# Patient Record
Sex: Male | Born: 1965 | Race: Black or African American | Hispanic: No | Marital: Single | Smoking: Never smoker
Health system: Southern US, Community
[De-identification: ages and names within clinical notes are randomized; demographics above are authoritative.]

## PROBLEM LIST (undated history)

## (undated) HISTORY — PX: OTHER SURGICAL HISTORY: SHX169

---

## 2003-07-21 ENCOUNTER — Emergency Department (HOSPITAL_COMMUNITY): Admission: AD | Admit: 2003-07-21 | Discharge: 2003-07-21 | Payer: Self-pay | Admitting: Family Medicine

## 2003-09-13 ENCOUNTER — Emergency Department (HOSPITAL_COMMUNITY): Admission: EM | Admit: 2003-09-13 | Discharge: 2003-09-13 | Payer: Self-pay | Admitting: Family Medicine

## 2006-11-22 ENCOUNTER — Emergency Department (HOSPITAL_COMMUNITY): Admission: EM | Admit: 2006-11-22 | Discharge: 2006-11-22 | Payer: Self-pay | Admitting: Family Medicine

## 2006-12-13 ENCOUNTER — Emergency Department (HOSPITAL_COMMUNITY): Admission: EM | Admit: 2006-12-13 | Discharge: 2006-12-13 | Payer: Self-pay | Admitting: Emergency Medicine

## 2006-12-19 ENCOUNTER — Inpatient Hospital Stay (HOSPITAL_COMMUNITY): Admission: EM | Admit: 2006-12-19 | Discharge: 2006-12-21 | Payer: Self-pay | Admitting: Emergency Medicine

## 2007-01-10 ENCOUNTER — Ambulatory Visit (HOSPITAL_COMMUNITY): Admission: RE | Admit: 2007-01-10 | Discharge: 2007-01-10 | Payer: Self-pay | Admitting: Neurology

## 2008-02-08 IMAGING — CT CT CHEST W/ CM
2 of 4 series · 15 of 36 positions shown, 18 images · IV contrast (APPLIED)
Comparison: None.

CLINICAL DATA: Evaluate mass.  Extremity heaviness and weakness.
 CHEST CT WITH CONTRAST:
TECHNIQUE: Multidetector CT imaging of the chest was performed following the standard protocol during bolus administration of intravenous contrast.
 Contrast: 80 cc Omnipaque 300.

[Series 2: routine chest 5.0 st · axial · 0.66mm/px · z∈[-352,-88]mm · 12 of 63 slices shown, 15 images]
[im 5/63  mediastinal]
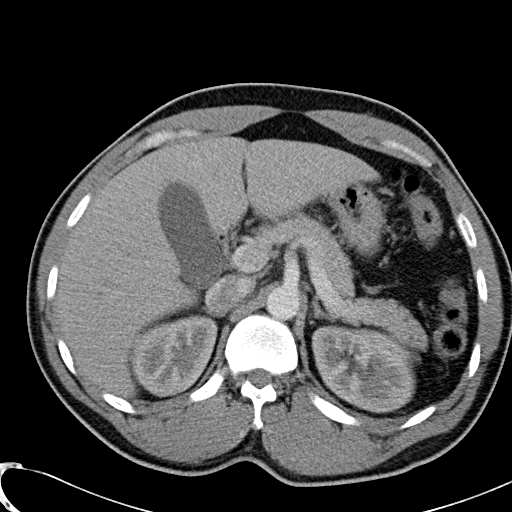
[im 5/63  lung]
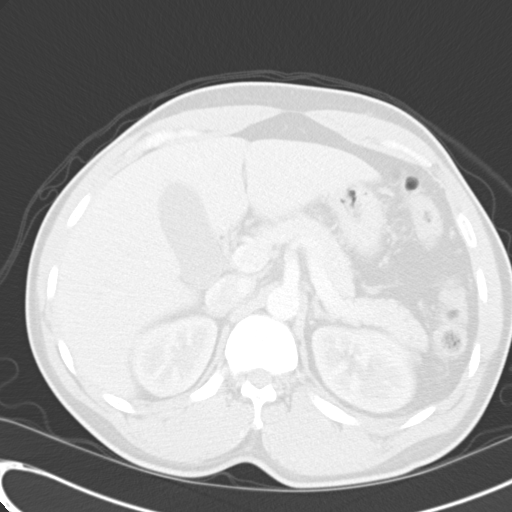
[im 9/63  lung]
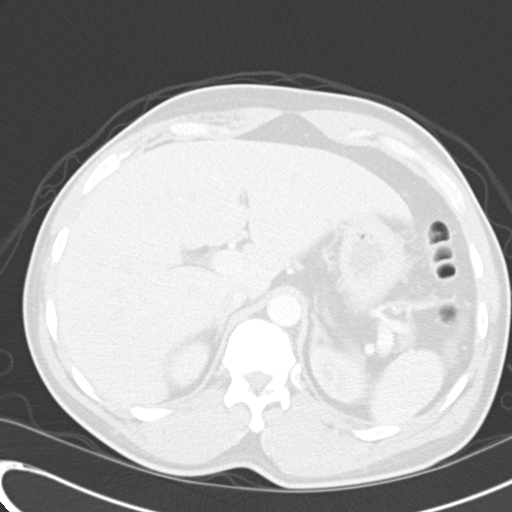
[im 14/63  lung]
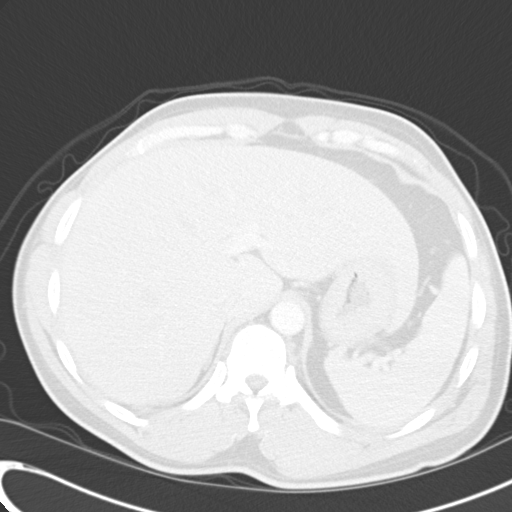
[im 18/63  lung]
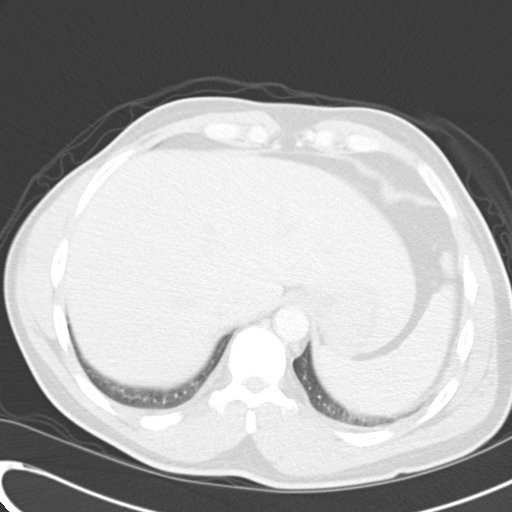
[im 23/63  mediastinal]
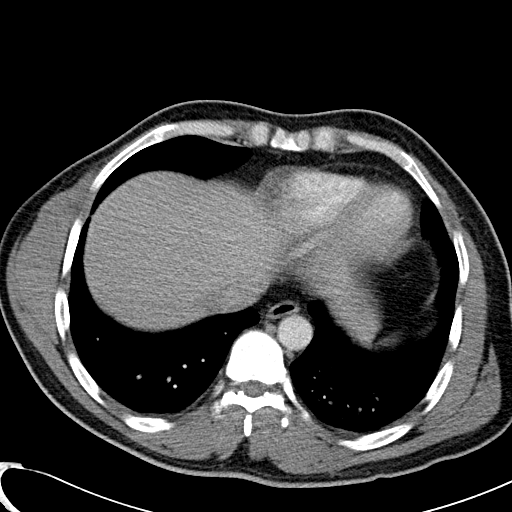
[im 23/63  lung]
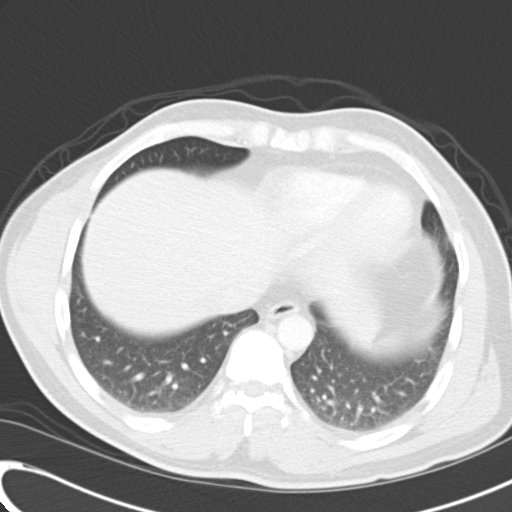
[im 27/63  lung]
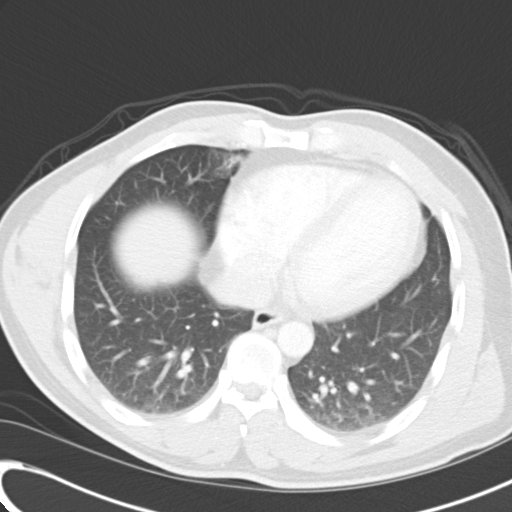
[im 36/63  lung]
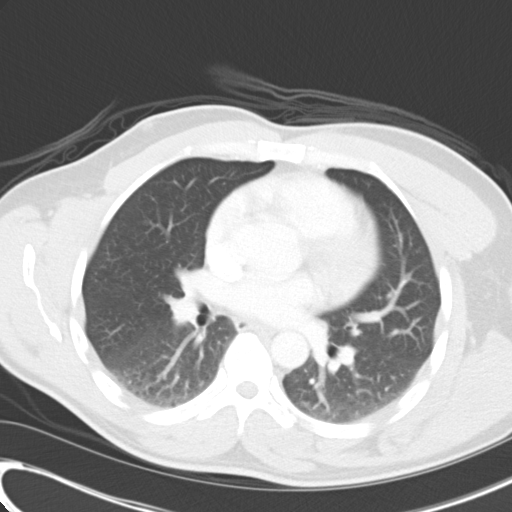
[im 40/63  lung]
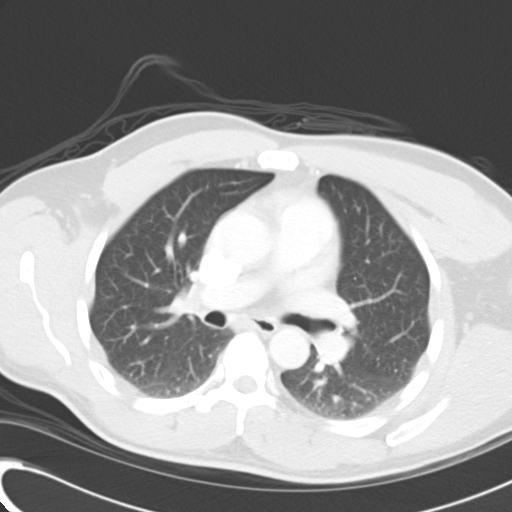
[im 45/63  mediastinal]
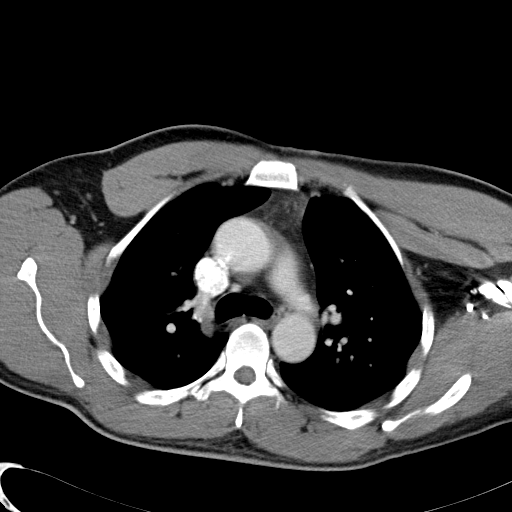
[im 45/63  lung]
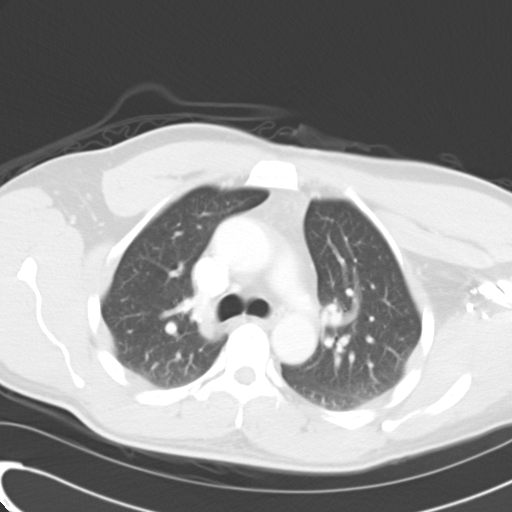
[im 49/63  lung]
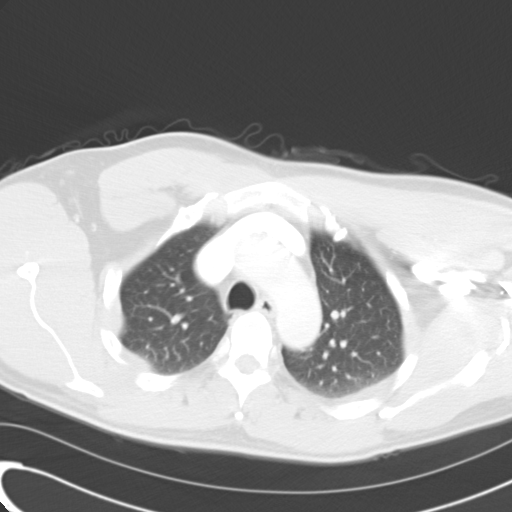
[im 54/63  lung]
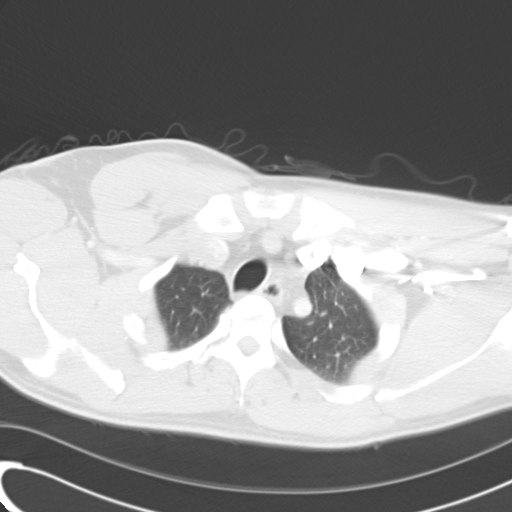
[im 58/63  lung]
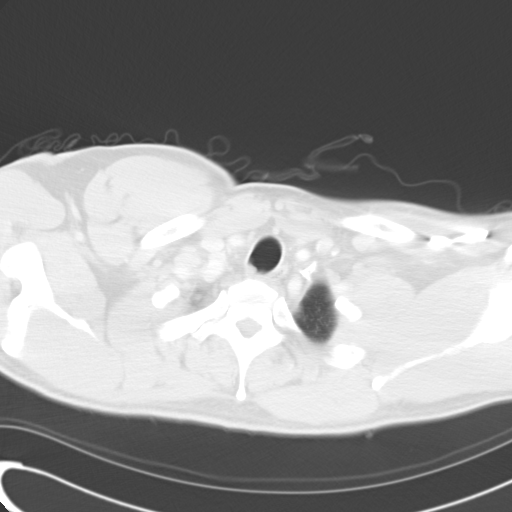

[Series 602: coronal chest · coronal · 0.66mm/px · 3 of 106 slices shown]
[im 22/106  lung]
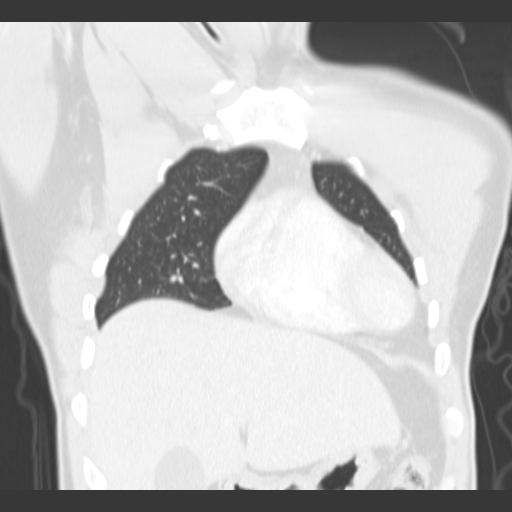
[im 43/106  lung]
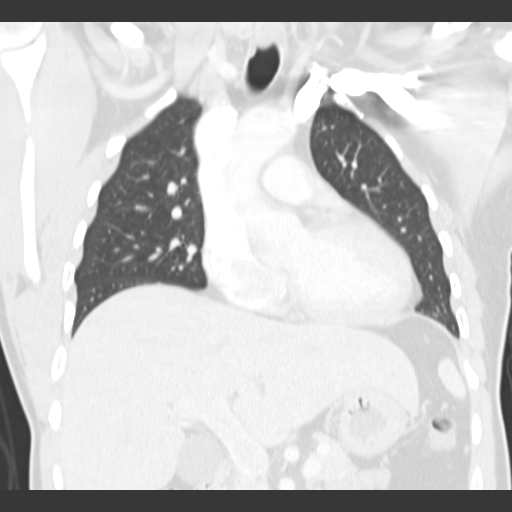
[im 64/106  lung]
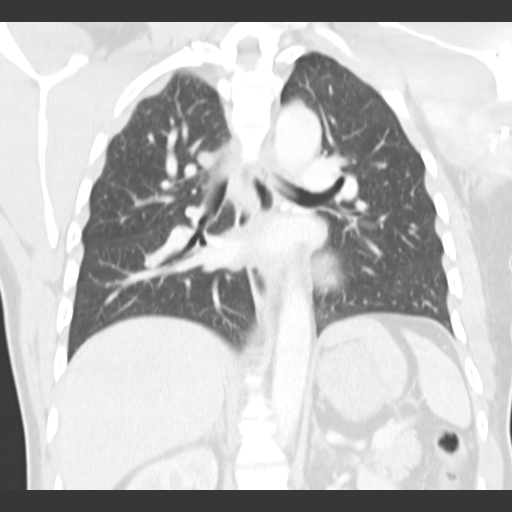

[15 of 36 positions shown; findings below may reference images not displayed]

FINDINGS: No pathologically enlarged mediastinal, hilar or axillary lymph nodes.  No mediastinal mass.  Heart size is at the upper limits of normal to mildly enlarged.  Mild bilateral gynecomastia.  Debris is seen in the dependent portion of the upper trachea.  Lungs are clear.  No pleural fluid.  
 Incidental imaging of the upper abdomen shows no acute findings.  No worrisome lytic or sclerotic lesions.
IMPRESSION: No acute findings.  No findings to explain the patient?s given symptoms.

## 2009-06-14 ENCOUNTER — Emergency Department (HOSPITAL_COMMUNITY): Admission: EM | Admit: 2009-06-14 | Discharge: 2009-06-14 | Payer: Self-pay | Admitting: Emergency Medicine

## 2010-06-20 ENCOUNTER — Encounter: Payer: Self-pay | Admitting: Internal Medicine

## 2010-06-20 ENCOUNTER — Ambulatory Visit (INDEPENDENT_AMBULATORY_CARE_PROVIDER_SITE_OTHER): Payer: Commercial Managed Care - PPO | Admitting: Internal Medicine

## 2010-06-20 DIAGNOSIS — Z23 Encounter for immunization: Secondary | ICD-10-CM

## 2010-06-20 DIAGNOSIS — G7000 Myasthenia gravis without (acute) exacerbation: Secondary | ICD-10-CM | POA: Insufficient documentation

## 2010-06-20 DIAGNOSIS — Z0000 Encounter for general adult medical examination without abnormal findings: Secondary | ICD-10-CM

## 2010-06-21 ENCOUNTER — Encounter: Payer: Self-pay | Admitting: Internal Medicine

## 2010-06-22 ENCOUNTER — Other Ambulatory Visit (INDEPENDENT_AMBULATORY_CARE_PROVIDER_SITE_OTHER): Payer: Commercial Managed Care - PPO

## 2010-06-22 ENCOUNTER — Other Ambulatory Visit: Payer: Self-pay | Admitting: Internal Medicine

## 2010-06-22 ENCOUNTER — Encounter (INDEPENDENT_AMBULATORY_CARE_PROVIDER_SITE_OTHER): Payer: Self-pay | Admitting: *Deleted

## 2010-06-22 DIAGNOSIS — Z0000 Encounter for general adult medical examination without abnormal findings: Secondary | ICD-10-CM

## 2010-06-22 LAB — BASIC METABOLIC PANEL
BUN: 11 mg/dL (ref 6–23)
CO2: 28 mEq/L (ref 19–32)
Calcium: 9.4 mg/dL (ref 8.4–10.5)
GFR: 85.05 mL/min (ref >60.00)
Sodium: 140 mEq/L (ref 135–145)

## 2010-06-22 LAB — CBC WITH DIFFERENTIAL/PLATELET
Basophils Relative: 0.6 % (ref 0.0–3.0)
Eosinophils Absolute: 0.1 10*3/uL (ref 0.0–0.7)
Eosinophils Relative: 1.8 % (ref 0.0–5.0)
Lymphs Abs: 1.7 10*3/uL (ref 0.7–4.0)
Monocytes Absolute: 0.7 10*3/uL (ref 0.1–1.0)
Neutro Abs: 3.6 10*3/uL (ref 1.4–7.7)

## 2010-06-22 LAB — LIPID PANEL
Cholesterol: 150 mg/dL (ref 0–200)
HDL: 40.7 mg/dL (ref >39.00)
Total CHOL/HDL Ratio: 4
Triglycerides: 64 mg/dL (ref 0.0–149.0)
VLDL: 12.8 mg/dL (ref 0.0–40.0)

## 2010-06-22 LAB — AST: AST: 23 U/L (ref 0–37)

## 2010-06-22 LAB — TSH: TSH: 0.44 u[IU]/mL (ref 0.35–5.50)

## 2010-06-27 ENCOUNTER — Telehealth: Payer: Self-pay | Admitting: Internal Medicine

## 2010-06-27 ENCOUNTER — Encounter (INDEPENDENT_AMBULATORY_CARE_PROVIDER_SITE_OTHER): Payer: Self-pay | Admitting: *Deleted

## 2010-06-28 ENCOUNTER — Encounter: Payer: Self-pay | Admitting: Internal Medicine

## 2010-06-28 NOTE — Miscellaneous (Signed)
Summary: Drugs that may unmask or exacerbate myasthenia gravis  Drugs that may unmask or exacerbate myasthenia gravis*  Anesthetic agents Antirheumatic drugs Chloroprocaine Chloroquine Diazepam Penicillamine Ether Cardiovascular drugs Halothane Beta blockers Ketamine Bretylium Lidocaine Procainamide Neuromuscular blocking agents Propafenone Propanidid Quinidine Procaine Verapamil and calcium channel blockers Antibiotics Glucocorticoids Aminoglycosides Corticotropin Amikacin Methylprednisolone Gentamicin Prednisone Kanamycin Neuromuscular blockers and muscle relaxants Neomycin Botulinum toxin Netilmicin Magnesium sulfate and magnesium salts Paromomycin Methocarbamol Spectinomycin Ophthalmologic drugs Streptomycin Betaxolol Tobramycin Echothiophate Fluoroquinolones Timolol Ciprofloxacin Tropicamide Gemifloxacin Proparacaine Levofloxacin Other drugs Moxifloxacin Anticholinergics Norfloxacin Carnitine Ofloxacin Cholinesterase inhibitors Others Deferoxamine Ampicillin Diuretics Clarithromycin Emetine (Ipecac syrup) Clindamycin Interferon alpha Colistin Iodinated contrast agents Erythromycin Narcotics Lincomycin Oral contraceptives Quinine Oxytocin Telithromycin Ritonavir and antiretroviral protease inhibitors Tetracyclines Statins Anticonvulsants Thyroxine Gabapentin  Phenytoin  Trimethadione  Antipsychotics  Chlorpromazine  Lithium  Phenothiazines  * Drugs listed here should be used with caution in patients with myasthenia gravis. Aminoglycosides should be used only if absolutely necessary with close monitoring. Please refer to the text for further information.   2012 [employer]. All rights reserved.   Support

## 2010-06-28 NOTE — Letter (Signed)
Summary: Pre Visit Letter Revised  [facility]  [address] [place], [place] [address]   Phone: [phone]  Fax: [phone]        06/21/2010 MRN: [id number]  [facility] [address] [place], [place]  [address]  [place]             Procedure Date:  07-13-10 9:30am           Dr Leone Payor Direct Colon   Welcome to the Gastroenterology Division at [facility].    You are scheduled to see a nurse for your pre-procedure visit on 06-29-10 at 9:30am on the 3rd floor at [facility], [address]  We ask that you try to arrive at our office 15 minutes prior to your appointment time to allow for check-in.  Please take a minute to review the attached form.  If you answer "Yes" to one or more of the questions on the first page, we ask that you call the person listed at your earliest opportunity.  If you answer "No" to all of the questions, please complete the rest of the form and bring it to your appointment.    Your nurse visit will consist of discussing your medical and surgical history, your immediate family medical history, and your medications.   If you are unable to list all of your medications on the form, please bring the medication bottles to your appointment and we will list them.  We will need to be aware of both prescribed and over the counter drugs.  We will need to know exact dosage information as well.    Please be prepared to read and sign documents such as consent forms, a financial agreement, and acknowledgement forms.  If necessary, and with your consent, a friend or relative is welcome to sit-in on the nurse visit with you.  Please bring your insurance card so that we may make a copy of it.  If your insurance requires a referral to see a specialist, please bring your referral form from your primary care physician.  No co-pay is required for this nurse visit.     If you cannot keep your appointment, please call [phone] to cancel or  reschedule prior to your appointment date.  This allows [place] the opportunity to schedule an appointment for another patient in need of care.    Thank you for choosing [facility] for your medical needs.  We appreciate the opportunity to care for you.  Please visit [place] at our website  to learn more about our practice.  Sincerely, The Gastroenterology Division

## 2010-06-28 NOTE — Assessment & Plan Note (Signed)
Summary: new to establish/cbs   Vital Signs:  Patient profile:   45 year old male Height:      75.5 inches Weight:      214 pounds BMI:     26.49 Pulse rate:   82 / minute Pulse rhythm:   regular BP sitting:   152 / 88  (left arm) Cuff size:   regular  Vitals Entered By: Army Fossa CMA (June 20, 2010 1:10 PM) CC: New to est, CPX- not fasting  Comments unsure of pharm   History of Present Illness: new patient   CPX Also has a history of myasthenia gravis, he has taken prednisone for a long time, recently, his neurologist moved and he is having a hard time getting seen or getting his prescriptions RF. As a consequence, he has stopped his prednisone several weeks ago and he only takes few pills now and then if he feels his myasthenia is acting up. Today he feels very well.  Preventive Screening-Counseling & Management  Alcohol-Tobacco     Smoking Status: never  Caffeine-Diet-Exercise     Does Patient Exercise: no  Current Medications (verified): 1)  Prednisone 10 Mg Tabs (Prednisone) .Marland Kitchen.. 1 By Mouth Daily.  Allergies (verified): No Known Drug Allergies  Past History:  Risk Factors: Exercise: no (06/20/2010)  Risk Factors: Smoking Status: never (06/20/2010)  Past Medical History: Myasthenia Gravis  dx-- 2008    Past Surgical History: no major surgeries   Family History: DM--no CAD--no colon ca--no prostate ca--no  Social History: Single children x 4  original from [place] religion--muslim in [place] since  ~ 1999 works at a hotel Never Smoked Alcohol use-no Regular exercise-no Smoking Status:  never Does Patient Exercise:  no  Review of Systems General:  Denies fever and weight loss. CV:  Denies chest pain or discomfort. Resp:  Denies cough, shortness of breath, sputum productive, and wheezing. GI:  Denies bloody stools, diarrhea, nausea, and vomiting. GU:  Denies dysuria and hematuria.  Physical Exam  General:  alert,  well-developed, and well-nourished.   Neck:  no masses, no thyromegaly, and normal carotid upstroke.   Lungs:  normal respiratory effort, no intercostal retractions, no accessory muscle use, and normal breath sounds.   Heart:  normal rate, regular rhythm, no murmur, and no gallop.   Abdomen:  soft, non-tender, no distention, no masses, no guarding, and no rigidity.   Rectal:  No external abnormalities noted. Normal sphincter tone. No rectal masses or tenderness. brown stool, Hemoccult trace positive Prostate:  Prostate gland firm and smooth, no enlargement, nodularity, tenderness, mass, asymmetry or induration. Extremities:  no edema Psych:  Oriented X3, memory intact for recent and remote, normally interactive, good eye contact, not anxious appearing, and not depressed appearing.     Impression & Recommendations:  Problem # 1:  ROUTINE GENERAL MEDICAL EXAM@HEALTH  CARE FACL (ICD-V70.0) Td -- > 10 years , gave one today flu shot-- recommended   I started his colon cancer screening today with Hemoccult, it is positive, refer to GI We'll also check a PSA (he is African American) Labs diet-exercise --- discussed, info provided   Orders: Gastroenterology Referral (GI)  Problem # 2:  MYASTHENIA GRAVIS WITHOUT EXACERBATION (ICD-358.00)  currently asymptomatic, we'll try to get him to see a neurologist as soon as possible.  Orders: Neurology Referral (Neuro)  Complete Medication List: 1)  Prednisone 10 Mg Tabs (Prednisone) .Marland Kitchen.. 1 by mouth daily.  Other Orders: Tdap => 86yrs IM (13086) Admin 1st Vaccine (57846)  Patient Instructions: 1)  make an appointment for labs, fasting: 2)  FLP PSA BMP AST ALT TSH CBC--- dx v70 3)  get a flu shot  4)  Please schedule a follow-up appointment in 6 months .    Orders Added: 1)  Tdap => 61yrs IM [90715] 2)  Admin 1st Vaccine [90471] 3)  Gastroenterology Referral [GI] 4)  New Patient 40-64 years [99386] 5)  Neurology Referral  [Neuro]   Immunizations Administered:  Tetanus Vaccine:    Vaccine Type: Tdap    Site: right deltoid    Mfr: GlaxoSmithKline    Dose: 0.5 ml    Route: IM    Given by: Army Fossa CMA    Exp. Date: 02/24/2012    Lot #: [id number]   Immunizations Administered:  Tetanus Vaccine:    Vaccine Type: Tdap    Site: right deltoid    Mfr: GlaxoSmithKline    Dose: 0.5 ml    Route: IM    Given by: Army Fossa CMA    Exp. Date: 02/24/2012    Lot #: [id number]   Risk Factors:  Tobacco use:  never Alcohol use:  no Exercise:  no

## 2010-06-30 ENCOUNTER — Other Ambulatory Visit (AMBULATORY_SURGERY_CENTER): Payer: Commercial Managed Care - PPO | Admitting: Internal Medicine

## 2010-06-30 ENCOUNTER — Other Ambulatory Visit: Payer: Self-pay | Admitting: Internal Medicine

## 2010-06-30 DIAGNOSIS — K648 Other hemorrhoids: Secondary | ICD-10-CM

## 2010-06-30 DIAGNOSIS — R195 Other fecal abnormalities: Secondary | ICD-10-CM

## 2010-06-30 DIAGNOSIS — D126 Benign neoplasm of colon, unspecified: Secondary | ICD-10-CM

## 2010-06-30 DIAGNOSIS — K921 Melena: Secondary | ICD-10-CM

## 2010-07-04 NOTE — Progress Notes (Signed)
Summary: needs CRNA/propofol  Phone Note Outgoing Call   Summary of Call: He has myasthenia gravis and I think colonoscopy best done with CRNA sedation and propofol most likely he has previsit 2/23 I still have availability for 2/24 propofol - ? if possible to line him up for colonoscopy then? if he is able if not should be set up with propofol/CRNA next scheduled date Iva Boop MD, [facility]  June 27, 2010 12:42 PM    Follow-up for Phone Call        Patient states he was unaware of either appointment.  He is scheduled for a colon with propofol for this Friday in the [facility] 2/24 and pre-visit is rescheduled to tomorrow at 2:30 Follow-up by: Darcey Nora RN, CGRN,  June 27, 2010 4:29 PM

## 2010-07-04 NOTE — Miscellaneous (Signed)
Summary: [facility] Previsit/prep  Clinical Lists Changes  Medications: Added new medication of MOVIPREP 100 GM  SOLR (PEG-KCL-NACL-NASULF-NA ASC-C) As per prep instructions. - Signed Rx of MOVIPREP 100 GM  SOLR (PEG-KCL-NACL-NASULF-NA ASC-C) As per prep instructions.;  #1 x 0;  Signed;  Entered by: Wyona Almas RN;  Authorized by: Iva Boop MD, [facility];  Method used: Print then Give to Patient Observations: Added new observation of NKA: T (06/28/2010 14:27)    Prescriptions: MOVIPREP 100 GM  SOLR (PEG-KCL-NACL-NASULF-NA ASC-C) As per prep instructions.  #1 x 0   Entered by:   Wyona Almas RN   Authorized by:   Iva Boop MD, [facility]   Signed by:   Wyona Almas RN on 06/28/2010   Method used:   Print then Give to Patient   RxID:   [id number]

## 2010-07-04 NOTE — Letter (Signed)
Summary: [facility] Instructions  [facility]  [address] [place], [place] [address]   Phone: [phone]  Fax: [phone]       Jason Ward    March 30, 1966    MRN: [id number]        Procedure Day Jason Ward:  Jason Ward  06/30/10     Arrival Time:  10:00AM     Procedure Time:  11:00AM     Location of Procedure:                    _ X_  Jason Ward (4th Floor)                      PREPARATION FOR COLONOSCOPY WITH MOVIPREP   Starting 5 days prior to your procedure 06/25/10 do not eat nuts, seeds, popcorn, corn, beans, peas,  salads, or any raw vegetables.  Do not take any fiber supplements (e.g. Metamucil, Citrucel, and Benefiber).  THE DAY BEFORE YOUR PROCEDURE         DATE: 06/29/10  DAY: THURSDAY  1.  Drink clear liquids the entire day-NO SOLID FOOD  2.  Do not drink anything colored red or purple.  Avoid juices with pulp.  No orange juice.  3.  Drink at least 64 oz. (8 glasses) of fluid/clear liquids during the day to prevent dehydration and help the prep work efficiently.  CLEAR LIQUIDS INCLUDE: Water Jello Ice Popsicles Tea (sugar ok, no milk/cream) Powdered fruit flavored drinks Coffee (sugar ok, no milk/cream) Gatorade Juice: apple, white grape, white cranberry  Lemonade Clear bullion, consomm, broth Carbonated beverages (any kind) Strained chicken noodle soup Hard Candy                             4.  In the morning, mix first dose of MoviPrep solution:    Empty 1 Pouch A and 1 Pouch B into the disposable container    Add lukewarm drinking water to the top line of the container. Mix to dissolve    Refrigerate (mixed solution should be used within 24 hrs)  5.  Begin drinking the prep at 5:00 p.m. The MoviPrep container is divided by 4 marks.   Every 15 minutes drink the solution down to the next mark (approximately 8 oz) until the full liter is complete.   6.  Follow completed prep with 16 oz of clear liquid of your choice  (Nothing red or purple).  Continue to drink clear liquids until bedtime.  7.  Before going to bed, mix second dose of MoviPrep solution:    Empty 1 Pouch A and 1 Pouch B into the disposable container    Add lukewarm drinking water to the top line of the container. Mix to dissolve    Refrigerate  THE DAY OF YOUR PROCEDURE      DATE: 06/30/10   DAY: FRIDAY  Beginning at 6:00AM (5 hours before procedure):         1. Every 15 minutes, drink the solution down to the next mark (approx 8 oz) until the full liter is complete.  2. Follow completed prep with 16 oz. of clear liquid of your choice.    3. You may drink clear liquids until 9:00AM (2 HOURS BEFORE PROCEDURE).   MEDICATION INSTRUCTIONS  Unless otherwise instructed, you should take regular prescription medications with a small sip of water   as early as possible the morning of  your procedure.        OTHER INSTRUCTIONS  You will need a responsible adult at least 47 years of age to accompany you and drive you home.   This person must remain in the waiting room during your procedure.  Wear loose fitting clothing that is easily removed.  Leave jewelry and other valuables at home.  However, you may wish to bring a book to read or  an iPod/MP3 player to listen to music as you wait for your procedure to start.  Remove all body piercing jewelry and leave at home.  Total time from sign-in until discharge is approximately 2-3 hours.  You should go home directly after your procedure and rest.  You can resume normal activities the  day after your procedure.  The day of your procedure you should not:   Drive   Make legal decisions   Operate machinery   Drink alcohol   Return to work  You will receive specific instructions about eating, activities and medications before you leave.    The above instructions have been reviewed and explained to me by  Jason Almas RN  June 28, 2010 3:24 PM     I fully understand  and can verbalize these instructions _____________________________ Date _________

## 2010-07-04 NOTE — Procedures (Addendum)
Summary: Colonoscopy  Patient: [name] Note: All result statuses are Final unless otherwise noted.  Tests: (1) Colonoscopy (COL)   COL Colonoscopy           DONE     [facility]     [address]     [place], [place]  [address]           COLONOSCOPY PROCEDURE REPORT           PATIENT:  Jason Ward, Jason Ward  MR#:  [id number]     BIRTHDATE:  [birth year], 45 yrs. old  GENDER:  male     ENDOSCOPIST:  Iva Boop, MD, [facility]     REF. BY:  Willow Ora, M.D.     PROCEDURE DATE:  06/30/2010     PROCEDURE:  Colonoscopy with snare polypectomy     ASA CLASS:  Class III     INDICATIONS:  heme positive stool     MEDICATIONS:   MAC sedation, administered by CRNA, propofol     (Diprivan) 200 mg IV           DESCRIPTION OF PROCEDURE:   After the risks benefits and     alternatives of the procedure were thoroughly explained, informed     consent was obtained.  Digital rectal exam was performed and     revealed no abnormalities and normal prostate.   The LB 180AL     [id number] endoscope was introduced through the anus and advanced to     the cecum, which was identified by both the appendix and ileocecal     valve, without limitations.  The quality of the prep was     excellent, using MoviPrep.  The instrument was then slowly     withdrawn as the colon was fully examined. Insertion     <<PROCEDUREIMAGES>>           FINDINGS:  A sessile polyp was found in the distal transverse     colon. It was 8 - 9 mm in size. Polyp was snared, then cauterized     with monopolar cautery. Retrieval was successful. This was     otherwise a normal examination of the colon, including right colon     retroflexion.   Retroflexed views in the rectum revealed internal     hemorrhoids.    The scope was then withdrawn from the patient and     the procedure completed.           COMPLICATIONS:  None     ENDOSCOPIC IMPRESSION:     1) 8 - 9 mm sessile polyp in the distal transverse colon     2) Internal  hemorrhoids     3) Otherwise normal examination     RECOMMENDATIONS:     1) Hold aspirin, aspirin products, and anti-inflammatory     medication for 2 weeks.     REPEAT EXAM:  In for Colonoscopy, pending biopsy results.           Iva Boop, MD, [facility]           CC:  Willow Ora, MD and The Patient           n.     eSIGNED:   Iva Boop at 06/30/2010 12:19 PM           Avner, [place], [id number]  Note: An exclamation mark (!) indicates a result that was not dispersed into the flowsheet. Document Creation Date: 06/30/2010 12:19 PM _______________________________________________________________________  Marland Kitchen  1) Order result status: Final Collection or observation date-time: 06/30/2010 12:10 Requested date-time:  Receipt date-time:  Reported date-time:  Referring Physician:   Ordering Physician: Stan Head [phone]) Specimen Source:  Source: Launa Grill Order Number: [id number] Lab site:   Appended Document: Colonoscopy     Procedures Next Due Date:    Colonoscopy: 07/2015

## 2010-07-05 ENCOUNTER — Encounter: Payer: Self-pay | Admitting: Internal Medicine

## 2010-07-13 ENCOUNTER — Other Ambulatory Visit: Payer: Commercial Managed Care - PPO | Admitting: Internal Medicine

## 2010-07-13 NOTE — Letter (Signed)
Summary: Patient Notice- Polyp Results  [facility]  [address] [place], [place] [address]   Phone: [phone]  Fax: [phone]        July 05, 2010 MRN: [id number]    [facility] [address] [place], [place]  [address]    Dear Mr. 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 This means that it was pre-cancerous or that  it had the potential to change into cancer over time.  I recommend that you have a repeat colonoscopy in 5 years to determine if you have developed any new polyps over time and screen for colorectal cancer. If you develop any new rectal bleeding, abdominal pain or significant bowel habit changes, please contact us before then.  In addition to repeating colonoscopy, changing health habits may reduce your risk of having more colon or rectal  polyps and possibly, colorectal cancer. You may lower your risk of future polyps and colorectal cancer by adopting healthy habits such as not smoking or using tobacco (if you do), being physically active, losing weight (if overweight), and eating a diet which includes fruits and vegetables and limits red meat.  Please call us if you are having persistent problems or have questions about your condition that have not been fully answered at this time.  Sincerely,  Iva Boop MD, [facility]  This letter has been electronically signed by your physician.  Appended Document: Patient Notice- Polyp Results letter mailed

## 2010-09-19 NOTE — H&P (Signed)
NAMEJENNIFER, Jason Ward           ACCOUNT NO.:  [id number]   MEDICAL RECORD NO.:  [id number]          PATIENT TYPE:  INP   LOCATION:  5148                         FACILITY:  [facility]   PHYSICIAN:  Bevelyn Buckles. Champey, M.D.DATE OF BIRTH:  07/31/1965   DATE OF ADMISSION:  12/19/2006  DATE OF DISCHARGE:                              HISTORY & PHYSICAL   REASON FOR ADMISSION:  Weakness/myasthenia gravis.   REQUESTING PHYSICIANS:  Dr. Bernette Mayers and Dr. Ignacia Palma.   HISTORY OF PRESENT ILLNESS:  Jason Ward is a 45 year old African  American male with a recent diagnosis of myasthenia gravis by Dr.  Thad Ranger, who presents with increased weakness and fall.  The patient  was scheduled for outpatient IVIG and was going to start IVIG when the  patient became extremely weak and fell to the floor, stating his lower  extremities gave out and he needed assistance to get back up on his  feet.  He is feeling much better and stronger, however, still feels  weakness in his legs.  He denies any dizziness, loss of consciousness or  lightheadedness.  This greatly  improved.  Denies any current numbness,  diplopia, speech changes, swallowing problems, chewing problems,  shortness of breath, vertigo or loss of consciousness.   PAST MEDICAL HISTORY:  Positive for recent diagnosis of myasthenia.   CURRENT MEDICATIONS:  Mestinon 60 mg t.i.d.   ALLERGIES:  NO KNOWN DRUG ALLERGIES.   FAMILY HISTORY:  Positive for hypertension.   SOCIAL HISTORY:  The patient lives with his roommate.  Denies any  alcohol or drug use.   REVIEW OF SYSTEMS:  Positive as per HPI.  Review of systems negative as  per HPI and greater than 7 other organ systems.   PHYSICAL EXAMINATION:  VITAL SIGNS:  Temperature is 97.4, blood pressure  is 140/83, pulse is 89, respirations 18.  HEENT:  Normocephalic, atraumatic.  The patient has scar around his  right eye from old injury.  The patient has some slight right ptosis.  Face is otherwise  symmetric.  NECK:  Supple.  HEART:  Regular.  LUNGS:  Clear.  ABDOMEN:  Soft.  EXTREMITIES: Show good pulses.  NEUROLOGICAL EXAMINATION:  The patient is awake, alert.  Language is  fluent.  The patient is following commands appropriately.  Cranial  nerves: The patient has right ptosis.  The rest of cranial nerves II-XII  grossly intact.  Motor examination shows 4-5 strength in bilateral  proximal upper extremities and 4-4+/5 strength in bilateral lower  extremities.  The patient has normal tone.  Sensory examination is  within normal limits to light touch.  Reflexes 1-2+ throughout.  Toes  are neutral bilaterally.  Cerebellar function is within normal limits  finger-to-nose.  Gait:  The patient can bear his own weight.  He takes a  few steps in the room without problems.   EKG shows normal sinus rhythm at 88 beats per minute.   LABS:  WBC 4.9, hemoglobin is 16.5, hematocrit is 47.5, platelets 272.  Sodium is 139, potassium 4.1 chloride is 104, CO2 is 30, BUN 7,  creatinine is 1.2, glucose 124.   IMPRESSION:  This is a 45 year old African American man recently  diagnosed with myasthenia gravis.  He had a fall secondary to weakness.  He also has other symptoms of right ptosis.  He was getting day 1 of 2  IVIG today for myasthenia gravis when he came to the emergency room.  Initially it was felt that the patient was getting a second day of IVIG  and it was ordered, his dose of IVIG, in the emergency room.  Later on  the patient states that he has not received any treatment.  His IVIG was  started.  We will admit the patient for 2 days of IVIG.  We will  continue his Mestinon at 60 mg per day.  We have obtained physical  therapy and occupational therapy evaluation in the emergency room.  They  had recommended possible outpatient physical therapy for energy  conservation techniques and strengthening.  We will consult them as we  will bring the patient in for observation and give him 2  days of 1 gram  of IVIG over 2 days.  His first dose will start in the emergency room.  We will have physical therapy continue to evaluate the patient while he  is here with plan on discharge within the next 1-2 days.  I will discuss  this further with Dr. Thad Ranger in the morning.      Bevelyn Buckles. Nash Shearer, M.D.  Electronically Signed     DRC/MEDQ  D:  12/19/2006  T:  12/20/2006  Job:  [id number]

## 2010-09-19 NOTE — Discharge Summary (Signed)
NAMEKIEGAN, MACARAEG           ACCOUNT NO.:  [id number]   MEDICAL RECORD NO.:  [id number]          PATIENT TYPE:  INP   LOCATION:  5148                         FACILITY:  [facility]   PHYSICIAN:  Casimiro Needle L. Reynolds, M.D.DATE OF BIRTH:  January 09, 1966   DATE OF ADMISSION:  12/19/2006  DATE OF DISCHARGE:  12/21/2006                               DISCHARGE SUMMARY   ADMISSION DIAGNOSIS:  Myasthenia gravis in need of intravenous  immunoglobulin infusion.   DISCHARGE DIAGNOSIS:  Myasthenia gravis in need of intravenous  immunoglobulin infusion.   CONDITION AT DISCHARGE:  Improved.   DIET:  Regular.   ACTIVITY:  Ad lib with gradual increase in activities.   MEDICATION AT DISCHARGE:  Mestinon 60 mg t.i.d.   LABORATORY REVIEW:  CBC on admission is normal.  Urinalysis on admission  is normal.  Basic metabolic panel on admission is unremarkable except  for an elevated glucose of 124.  He has had no imaging during this  admission.   HOSPITAL COURSE:  Please see admission H&P by Dr. Imagene Gurney for full  initial details.  Briefly, this is a 45 year old man with a recent  diagnosis of myasthenia gravis.  He was scheduled to have outpatient  IVIG infusion on August 14 and August 15.  However, he fell on the  morning of August 14 and was evaluated in the emergency department and  his IVIG infusion was delayed until late in the evening.  For that  reason, it also was felt that it was more appropriate to have him  admitted to the hospital to receive his infusions on the evening of the  14th and again on the evening of the 15th rather than having to receive  his entire dose of IVIG in less than 24 hours which would place him at  risk for a hypercoagulable state.  The patient was brought into the  hospital and observed.  He tolerated his IVIG without difficulty.  He  was seen by PT and OT who recommended only gradual increased activities  to increase his exercise tolerance.  On the morning of  discharge, he is  awake and alert.  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  He has mild eye closure and cheek puff  weakness but, otherwise, normal strength in the face.  He has minimal  weakness in neck flexors, which does represent an improvement over  previous exams.  On motor examination, he has 4/5 strength in the  deltoid, 4/5 strength in the triceps, 4+/5 strength in the biceps,  minimal weakness of grip.  He has 4/5 strength in the iliopsoas and  normal strength in the dorsiflexors.   PLAN:  He will follow up as scheduled with Dr. Kelli Hope next  week.  Plan at that time will be to start prednisone if he continues to  enjoy improvement with his IVIG.  We will need to check routine labs  including renal function at that time.  He also needs a CT of the chest  and antiMUSK antibodies drawn at some point.      Michael L. Thad Ranger, M.D.  Electronically Signed     MLR/MEDQ  D:  12/21/2006  T:  12/21/2006  Job:  [id number]

## 2010-12-20 ENCOUNTER — Ambulatory Visit (INDEPENDENT_AMBULATORY_CARE_PROVIDER_SITE_OTHER): Payer: Commercial Managed Care - PPO | Admitting: Internal Medicine

## 2010-12-20 ENCOUNTER — Encounter: Payer: Self-pay | Admitting: Internal Medicine

## 2010-12-20 DIAGNOSIS — G7000 Myasthenia gravis without (acute) exacerbation: Secondary | ICD-10-CM | POA: Insufficient documentation

## 2010-12-20 DIAGNOSIS — H539 Unspecified visual disturbance: Secondary | ICD-10-CM

## 2010-12-20 NOTE — Assessment & Plan Note (Addendum)
Currently under the care of nephrology, on prednisone. For future reference I am copying a list of medications to avoid on myasthenia gravis   Drugs that may unmask or exacerbate myasthenia gravis*  Anesthetic agents Antirheumatic drugs  Chloroprocaine Chloroquine  Diazepam Penicillamine  Ether Cardiovascular drugs  Halothane Beta blockers  Ketamine Bretylium  Lidocaine Procainamide  Neuromuscular blocking agents Propafenone  Propanidid Quinidine  Procaine Verapamil and calcium channel blockers  Antibiotics Glucocorticoids  Aminoglycosides Corticotropin  Amikacin Methylprednisolone  Gentamicin Prednisone  Kanamycin Neuromuscular blockers and muscle relaxants  Neomycin Botulinum toxin  Netilmicin Magnesium sulfate and magnesium salts  Paromomycin Methocarbamol  Spectinomycin Ophthalmologic drugs  Streptomycin Betaxolol  Tobramycin Echothiophate  Fluoroquinolones Timolol  Ciprofloxacin Tropicamide  Gemifloxacin Proparacaine  Levofloxacin Other drugs  Moxifloxacin Anticholinergics  Norfloxacin Carnitine  Ofloxacin Cholinesterase inhibitors  Others Deferoxamine  Ampicillin Diuretics  Clarithromycin Emetine (Ipecac syrup)  Clindamycin Interferon alpha  Colistin Iodinated contrast agents  Erythromycin Narcotics  Lincomycin Oral contraceptives  Quinine Oxytocin  Telithromycin Ritonavir and antiretroviral protease inhibitors  Tetracyclines Statins  Anticonvulsants Thyroxine  Gabapentin   Phenytoin   Trimethadione   Antipsychotics   Chlorpromazine   Lithium   Phenothiazines   * Drugs listed here should be used with caution in patients with myasthenia gravis. Aminoglycosides should be used only if absolutely necessary with close monitoring. Please refer to the text for further information.   2012 [employer] All rights reserved.  Subscription and License Agreement Support
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
## 2011-02-19 LAB — POCT I-STAT CREATININE
Creatinine, Ser: 1
Operator id: 247071

## 2011-02-19 LAB — I-STAT 8, (EC8 V) (CONVERTED LAB)
Acid-Base Excess: 1
Acid-Base Excess: 1
BUN: 7
BUN: 13
Bicarbonate: 25.6 — ABNORMAL HIGH
Chloride: 104
Chloride: 103
Glucose, Bld: 93
HCT: 52
HCT: 53 — ABNORMAL HIGH
Hemoglobin: 17.7 — ABNORMAL HIGH
Hemoglobin: 18 — ABNORMAL HIGH
Operator id: 247071
Potassium: 4.1
Potassium: 4.2
Sodium: 136
TCO2: 27
pCO2, Ven: 53.5 — ABNORMAL HIGH
pCO2, Ven: 39.7 — ABNORMAL LOW
pH, Ven: 7.417 — ABNORMAL HIGH

## 2011-02-19 LAB — CBC
HCT: 47.4
HCT: 47.5
HCT: 47.9
Hemoglobin: 16.2
Hemoglobin: 16.5
Hemoglobin: 16.6
MCHC: 34.2
MCHC: 34.6
MCV: 92.1
MCV: 92.9
Platelets: 262
Platelets: 272
RBC: 5.12
RBC: 5.15
RBC: 5.15
RDW: 11.9
RDW: 12.8
RDW: 13.1
WBC: 4.9
WBC: 5.1
WBC: 4.7

## 2011-02-19 LAB — URINALYSIS, ROUTINE W REFLEX MICROSCOPIC
Bilirubin Urine: NEGATIVE
Bilirubin Urine: NEGATIVE
Glucose, UA: NEGATIVE
Glucose, UA: NEGATIVE
Hgb urine dipstick: NEGATIVE
Hgb urine dipstick: NEGATIVE
Ketones, ur: NEGATIVE
Ketones, ur: NEGATIVE
Nitrite: NEGATIVE
Nitrite: NEGATIVE
Protein, ur: NEGATIVE
Protein, ur: NEGATIVE
Specific Gravity, Urine: 1.01
Specific Gravity, Urine: 1.01
Urobilinogen, UA: 0.2
Urobilinogen, UA: 0.2
pH: 6.5
pH: 7.5

## 2011-02-19 LAB — DIFFERENTIAL
Basophils Absolute: 0
Basophils Absolute: 0.1
Basophils Absolute: 0
Basophils Relative: 1
Basophils Relative: 1
Basophils Relative: 1
Eosinophils Absolute: 0.2
Eosinophils Absolute: 0.2
Eosinophils Relative: 3
Eosinophils Relative: 4
Lymphocytes Relative: 26
Lymphocytes Relative: 30
Lymphs Abs: 1.3
Lymphs Abs: 1.5
Lymphs Abs: 1.6
Monocytes Absolute: 0.4
Monocytes Absolute: 0.7
Monocytes Relative: 14 — ABNORMAL HIGH
Monocytes Relative: 9
Neutro Abs: 2.6
Neutro Abs: 2.9
Neutrophils Relative %: 52
Neutrophils Relative %: 60

## 2011-02-19 LAB — BASIC METABOLIC PANEL
BUN: 11
CO2: 29
Chloride: 101
GFR calc Af Amer: >60
GFR calc non Af Amer: >60
Sodium: 138

## 2011-02-19 LAB — COMPREHENSIVE METABOLIC PANEL
AST: 115 — ABNORMAL HIGH
Alkaline Phosphatase: 64
BUN: 11
Calcium: 9.7
Creatinine, Ser: 0.96
GFR calc Af Amer: >60
Glucose, Bld: 92
Total Bilirubin: 0.7

## 2011-02-19 LAB — BASIC METABOLIC PANEL WITH GFR
Calcium: 9.6
Creatinine, Ser: 1
Glucose, Bld: 118 — ABNORMAL HIGH
Potassium: 4.1

## 2011-02-19 LAB — MAGNESIUM: Magnesium: 2.1

## 2011-02-19 LAB — CK
Total CK: 208
Total CK: 691 — ABNORMAL HIGH

## 2012-02-18 ENCOUNTER — Ambulatory Visit: Payer: Commercial Managed Care - PPO

## 2012-02-18 ENCOUNTER — Ambulatory Visit (INDEPENDENT_AMBULATORY_CARE_PROVIDER_SITE_OTHER): Payer: Commercial Managed Care - PPO | Admitting: Family Medicine

## 2012-02-18 VITALS — BP 163/104 | HR 70 | Temp 98.0°F | Resp 18 | Ht 74.5 in | Wt 202.6 lb

## 2012-02-18 DIAGNOSIS — R079 Chest pain, unspecified: Secondary | ICD-10-CM

## 2012-02-18 MED ORDER — HYDROCODONE-ACETAMINOPHEN 5-500 MG PO TABS: 1.0000 | ORAL_TABLET | Freq: Three times a day (TID) | ORAL | Status: DC | PRN

## 2012-02-18 NOTE — Patient Instructions (Signed)
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
## 2012-06-23 ENCOUNTER — Ambulatory Visit (INDEPENDENT_AMBULATORY_CARE_PROVIDER_SITE_OTHER): Payer: Commercial Managed Care - PPO | Admitting: Internal Medicine

## 2012-06-23 ENCOUNTER — Encounter: Payer: Self-pay | Admitting: Internal Medicine

## 2012-06-23 VITALS — BP 132/76 | HR 88 | Temp 98.4°F | Ht 75.0 in | Wt 203.0 lb

## 2012-06-23 DIAGNOSIS — J209 Acute bronchitis, unspecified: Secondary | ICD-10-CM

## 2012-06-23 MED ORDER — SULFAMETHOXAZOLE-TRIMETHOPRIM 800-160 MG PO TABS: 1.0000 | ORAL_TABLET | Freq: Two times a day (BID) | ORAL | Status: DC

## 2012-06-23 NOTE — Patient Instructions (Addendum)
Rest, fluids , tylenol For cough, take Mucinex DM twice a day as needed   Take the antibiotic as prescribed  (bactrim) only if not improving in the next 2 or 3 days Call if no better by next week Call anytime if the symptoms are severe ---- Schedule a physical exam at your earliest convenience

## 2012-06-23 NOTE — Progress Notes (Signed)
  Subjective:    Patient ID: Jason Ward, male    DOB: 11-14-1965, 47 y.o.   MRN: [id number]  HPI Acute visit, 641: Dry cough, taking OTCs, not getting worse or better.  Past Medical History  Diagnosis Date  . Myasthenia gravis 2008   Past Surgical History  Procedure Laterality Date  . Facial injury      as a child     Social History:  Single, children x 4  original from [place]  religion--muslim  in [place] since ~ 1999  works at a hotel  Never Smoked  Alcohol use-no     Review of Systems No fever or chills No nausea, vomiting diarrhea. No myalgias. No wheezing.     Objective:   Physical Exam General -- alert, well-developed  HEENT -- TMs normal, throat w/o redness, face symmetric and not tender to palpation, nose is slightly congested  Lungs -- normal respiratory effort, no intercostal retractions, no accessory muscle use, and few rhonchi with cough only,  no wheezing  Heart-- normal rate, regular rhythm, no murmur, and no gallop.   Psych-- Cognition and judgment appear intact. Alert and cooperative with normal attention span and concentration.  not anxious appearing and not depressed appearing.       Assessment & Plan:   Mild bronchitis, see instructions

## 2013-08-19 ENCOUNTER — Emergency Department (HOSPITAL_COMMUNITY)
Admission: EM | Admit: 2013-08-19 | Discharge: 2013-08-19 | Disposition: A | Discharge status: Home / Self Care | Payer: Commercial Managed Care - PPO | Attending: Emergency Medicine | Admitting: Emergency Medicine

## 2013-08-19 ENCOUNTER — Emergency Department (HOSPITAL_COMMUNITY): Payer: Commercial Managed Care - PPO

## 2013-08-19 ENCOUNTER — Encounter (HOSPITAL_COMMUNITY): Payer: Self-pay | Admitting: Emergency Medicine

## 2013-08-19 DIAGNOSIS — R110 Nausea: Secondary | ICD-10-CM | POA: Insufficient documentation

## 2013-08-19 DIAGNOSIS — R030 Elevated blood-pressure reading, without diagnosis of hypertension: Secondary | ICD-10-CM | POA: Insufficient documentation

## 2013-08-19 DIAGNOSIS — IMO0002 Reserved for concepts with insufficient information to code with codable children: Secondary | ICD-10-CM | POA: Insufficient documentation

## 2013-08-19 DIAGNOSIS — H5789 Other specified disorders of eye and adnexa: Secondary | ICD-10-CM | POA: Insufficient documentation

## 2013-08-19 DIAGNOSIS — J069 Acute upper respiratory infection, unspecified: Secondary | ICD-10-CM | POA: Insufficient documentation

## 2013-08-19 DIAGNOSIS — Z8669 Personal history of other diseases of the nervous system and sense organs: Secondary | ICD-10-CM | POA: Insufficient documentation

## 2013-08-19 DIAGNOSIS — IMO0001 Reserved for inherently not codable concepts without codable children: Secondary | ICD-10-CM

## 2013-08-19 LAB — CBC WITH DIFFERENTIAL/PLATELET
BASOS ABS: 0 10*3/uL (ref 0.0–0.1)
Basophils Relative: 0 % (ref 0–1)
EOS ABS: 0.1 10*3/uL (ref 0.0–0.7)
EOS PCT: 1 % (ref 0–5)
HCT: 42.5 % (ref 39.0–52.0)
Hemoglobin: 15.4 g/dL (ref 13.0–17.0)
LYMPHS ABS: 0.5 10*3/uL — AB (ref 0.7–4.0)
Lymphocytes Relative: 7 % — ABNORMAL LOW (ref 12–46)
MCH: 32.2 pg (ref 26.0–34.0)
MCHC: 36.2 g/dL — ABNORMAL HIGH (ref 30.0–36.0)
MCV: 88.9 fL (ref 78.0–100.0)
Monocytes Absolute: 1.2 10*3/uL — ABNORMAL HIGH (ref 0.1–1.0)
Monocytes Relative: 18 % — ABNORMAL HIGH (ref 3–12)
NEUTROS PCT: 74 % (ref 43–77)
Neutro Abs: 5 10*3/uL (ref 1.7–7.7)
PLATELETS: 191 10*3/uL (ref 150–400)
RBC: 4.78 MIL/uL (ref 4.22–5.81)
RDW: 12.2 % (ref 11.5–15.5)
WBC: 6.9 10*3/uL (ref 4.0–10.5)

## 2013-08-19 LAB — BASIC METABOLIC PANEL
BUN: 15 mg/dL (ref 6–23)
CALCIUM: 9.2 mg/dL (ref 8.4–10.5)
CO2: 24 mEq/L (ref 19–32)
Chloride: 102 mEq/L (ref 96–112)
Creatinine, Ser: 1.24 mg/dL (ref 0.50–1.35)
GFR, EST AFRICAN AMERICAN: 77 mL/min — AB (ref >90)
GFR, EST NON AFRICAN AMERICAN: 66 mL/min — AB (ref >90)
GLUCOSE: 97 mg/dL (ref 70–99)
POTASSIUM: 3.9 meq/L (ref 3.7–5.3)
SODIUM: 139 meq/L (ref 137–147)

## 2013-08-19 LAB — TROPONIN I

## 2013-08-19 LAB — RAPID STREP SCREEN (MED CTR MEBANE ONLY): STREPTOCOCCUS, GROUP A SCREEN (DIRECT): NEGATIVE

## 2013-08-19 MED ORDER — SODIUM CHLORIDE 0.9 % IV BOLUS (SEPSIS)
1000.0000 mL | Freq: Once | INTRAVENOUS | Status: AC
  Administered 2013-08-19: 1000 mL via INTRAVENOUS

## 2013-08-19 MED ORDER — IBUPROFEN 800 MG PO TABS
800.0000 mg | ORAL_TABLET | Freq: Once | ORAL | Status: AC
  Administered 2013-08-19: 800 mg via ORAL
  Filled 2013-08-19: qty 1

## 2013-08-19 NOTE — ED Notes (Signed)
Pt also reports sob and "hard to breath."  Speaking in complete sentences.
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
## 2013-08-19 NOTE — Progress Notes (Signed)
NIF -55, VC 2.5L. Multiple successful attempts

## 2013-08-19 NOTE — Discharge Instructions (Signed)
Drink fluids and rest  Take 600-800 mg Ibuprofen every 6-8 hours as needed  Follow-up with your doctor regarding your blood pressure - it was elevated throughout your ED visit  Return to the emergency department if you develop any changing/worsening condition, difficulty breathing, fever, coughing up blood, changing or worsening chest pain, feeling like you are going to pass out or any other concerns (please read additional information regarding your condition below)     Upper Respiratory Infection, Adult An upper respiratory infection (URI) is also known as the common cold. It is often caused by a type of germ (virus). Colds are easily spread (contagious). You can pass it to others by kissing, coughing, sneezing, or drinking out of the same glass. Usually, you get better in 1 or 2 weeks.  HOME CARE   Only take medicine as told by your doctor.  Use a warm mist humidifier or breathe in steam from a hot shower.  Drink enough water and fluids to keep your pee (urine) clear or pale yellow.  Get plenty of rest.  Return to work when your temperature is back to normal or as told by your doctor. You may use a face mask and wash your hands to stop your cold from spreading. GET HELP RIGHT AWAY IF:   After the first few days, you feel you are getting worse.  You have questions about your medicine.  You have chills, shortness of breath, or brown or red spit (mucus).  You have yellow or brown snot (nasal discharge) or pain in the face, especially when you bend forward.  You have a fever, puffy (swollen) neck, pain when you swallow, or white spots in the back of your throat.  You have a bad headache, ear pain, sinus pain, or chest pain.  You have a high-pitched whistling sound when you breathe in and out (wheezing).  You have a lasting cough or cough up blood.  You have sore muscles or a stiff neck. MAKE SURE YOU:   Understand these instructions.  Will watch your condition.  Will get  help right away if you are not doing well or get worse. Document Released: 10/10/2007 Document Revised: 07/16/2011 Document Reviewed: 08/28/2010 [facility] Patient Information 2014 [place], [place].  Cough, Adult  A cough is a reflex that helps clear your throat and airways. It can help heal the body or may be a reaction to an irritated airway. A cough may only last 2 or 3 weeks (acute) or may last more than 8 weeks (chronic).  CAUSES Acute cough: Viral or bacterial infections. Chronic cough: Infections. Allergies. Asthma. Post-nasal drip. Smoking. Heartburn or acid reflux. Some medicines. Chronic lung problems (COPD). Cancer. SYMPTOMS  Cough. Fever. Chest pain. Increased breathing rate. High-pitched whistling sound when breathing (wheezing). Colored mucus that you cough up (sputum). TREATMENT  A bacterial cough may be treated with antibiotic medicine. A viral cough must run its course and will not respond to antibiotics. Your caregiver may recommend other treatments if you have a chronic cough. HOME CARE INSTRUCTIONS  Only take over-the-counter or prescription medicines for pain, discomfort, or fever as directed by your caregiver. Use cough suppressants only as directed by your caregiver. Use a cold steam vaporizer or humidifier in your bedroom or home to help loosen secretions. Sleep in a semi-upright position if your cough is worse at night. Rest as needed. Stop smoking if you smoke. SEEK IMMEDIATE MEDICAL CARE IF:  You have pus in your sputum. Your cough starts to worsen. You  cannot control your cough with suppressants and are losing sleep. You begin coughing up blood. You have difficulty breathing. You develop pain which is getting worse or is uncontrolled with medicine. You have a fever. MAKE SURE YOU:  Understand these instructions. Will watch your condition. Will get help right away if you are not doing well or get worse. Document Released: 10/20/2010 Document  Revised: 07/16/2011 Document Reviewed: 10/20/2010 [facility] Patient Information 2014 [place].  Chest Pain (Nonspecific) It is often hard to give a specific diagnosis for the cause of chest pain. There is always a chance that your pain could be related to something serious, such as a heart attack or a blood clot in the lungs. You need to follow up with your caregiver for further evaluation. CAUSES   Heartburn.  Pneumonia or bronchitis.  Anxiety or stress.  Inflammation around your heart (pericarditis) or lung (pleuritis or pleurisy).  A blood clot in the lung.  A collapsed lung (pneumothorax). It can develop suddenly on its own (spontaneous pneumothorax) or from injury (trauma) to the chest.  Shingles infection (herpes zoster virus). The chest wall is composed of bones, muscles, and cartilage. Any of these can be the source of the pain.  The bones can be bruised by injury.  The muscles or cartilage can be strained by coughing or overwork.  The cartilage can be affected by inflammation and become sore (costochondritis). DIAGNOSIS  Lab tests or other studies, such as X-rays, electrocardiography, stress testing, or cardiac imaging, may be needed to find the cause of your pain.  TREATMENT   Treatment depends on what may be causing your chest pain. Treatment may include:  Acid blockers for heartburn.  Anti-inflammatory medicine.  Pain medicine for inflammatory conditions.  Antibiotics if an infection is present.  You may be advised to change lifestyle habits. This includes stopping smoking and avoiding alcohol, caffeine, and chocolate.  You may be advised to keep your head raised (elevated) when sleeping. This reduces the chance of acid going backward from your stomach into your esophagus.  Most of the time, nonspecific chest pain will improve within 2 to 3 days with rest and mild pain medicine. HOME CARE INSTRUCTIONS   If antibiotics were prescribed, take your  antibiotics as directed. Finish them even if you start to feel better.  For the next few days, avoid physical activities that bring on chest pain. Continue physical activities as directed.  Do not smoke.  Avoid drinking alcohol.  Only take over-the-counter or prescription medicine for pain, discomfort, or fever as directed by your caregiver.  Follow your caregiver's suggestions for further testing if your chest pain does not go away.  Keep any follow-up appointments you made. If you do not go to an appointment, you could develop lasting (chronic) problems with pain. If there is any problem keeping an appointment, you must call to reschedule. SEEK MEDICAL CARE IF:   You think you are having problems from the medicine you are taking. Read your medicine instructions carefully.  Your chest pain does not go away, even after treatment.  You develop a rash with blisters on your chest. SEEK IMMEDIATE MEDICAL CARE IF:   You have increased chest pain or pain that spreads to your arm, neck, jaw, back, or abdomen.  You develop shortness of breath, an increasing cough, or you are coughing up blood.  You have severe back or abdominal pain, feel nauseous, or vomit.  You develop severe weakness, fainting, or chills.  You have a fever.  THIS IS AN EMERGENCY. Do not wait to see if the pain will go away. Get medical help at once. Call your local emergency services (911 in [place] Do not drive yourself to the hospital. MAKE SURE YOU:   Understand these instructions.  Will watch your condition.  Will get help right away if you are not doing well or get worse. Document Released: 01/31/2005 Document Revised: 07/16/2011 Document Reviewed: 11/27/2007 [facility] Patient Information 2014 [place], [place].  Shortness of Breath Shortness of breath means you have trouble breathing. Shortness of breath may indicate that you have a medical problem. You should seek immediate medical care for shortness of  breath. CAUSES   Not enough oxygen in the air (as with high altitudes or a smoke-filled room).  Short-term (acute) lung disease, including:  Infections, such as pneumonia.  Fluid in the lungs, such as heart failure.  A blood clot in the lungs (pulmonary embolism).  Long-term (chronic) lung diseases.  Heart disease (heart attack, angina, heart failure, and others).  Low red blood cells (anemia).  Poor physical fitness. This can cause shortness of breath when you exercise.  Chest or back injuries or stiffness.  Being overweight.  Smoking.  Anxiety. This can make you feel like you are not getting enough air. DIAGNOSIS  Serious medical problems can usually be found during your physical exam. Tests may also be done to determine why you are having shortness of breath. Tests may include:  Chest X-rays.  Lung function tests.  Blood tests.  Electrocardiography.  Exercise testing.  Echocardiography.  Imaging scans. Your caregiver may not be able to find a cause for your shortness of breath after your exam. In this case, it is important to have a follow-up exam with your caregiver as directed.  TREATMENT  Treatment for shortness of breath depends on the cause of your symptoms and can vary greatly. HOME CARE INSTRUCTIONS   Do not smoke. Smoking is a common cause of shortness of breath. If you smoke, ask for help to quit.  Avoid being around chemicals or things that may bother your breathing, such as paint fumes and dust.  Rest as needed. Slowly resume your usual activities.  If medicines were prescribed, take them as directed for the full length of time directed. This includes oxygen and any inhaled medicines.  Keep all follow-up appointments as directed by your caregiver. SEEK MEDICAL CARE IF:   Your condition does not improve in the time expected.  You have a hard time doing your normal activities even with rest.  You have any side effects or problems with the  medicines prescribed.  You develop any new symptoms. SEEK IMMEDIATE MEDICAL CARE IF:   Your shortness of breath gets worse.  You feel lightheaded, faint, or develop a cough not controlled with medicines.  You start coughing up blood.  You have pain with breathing.  You have chest pain or pain in your arms, shoulders, or abdomen.  You have a fever.  You are unable to walk up stairs or exercise the way you normally do. MAKE SURE YOU:  Understand these instructions.  Will watch your condition.  Will get help right away if you are not doing well or get worse. Document Released: 01/16/2001 Document Revised: 10/23/2011 Document Reviewed: 07/09/2011 [facility] Patient Information 2014 [place], [place].  Hypertension As your heart beats, it forces blood through your arteries. This force is your blood pressure. If the pressure is too high, it is called hypertension (HTN) or high blood pressure. HTN  is dangerous because you may have it and not know it. High blood pressure may mean that your heart has to work harder to pump blood. Your arteries may be narrow or stiff. The extra work puts you at risk for heart disease, stroke, and other problems.  Blood pressure consists of two numbers, a higher number over a lower, 110/72, for example. It is stated as "110 over 72." The ideal is below 120 for the top number (systolic) and under 80 for the bottom (diastolic). Write down your blood pressure today. You should pay close attention to your blood pressure if you have certain conditions such as:  Heart failure.  Prior heart attack.  Diabetes  Chronic kidney disease.  Prior stroke.  Multiple risk factors for heart disease. To see if you have HTN, your blood pressure should be measured while you are seated with your arm held at the level of the heart. It should be measured at least twice. A one-time elevated blood pressure reading (especially in the Emergency Department) does not mean that you  need treatment. There may be conditions in which the blood pressure is different between your right and left arms. It is important to see your caregiver soon for a recheck. Most people have essential hypertension which means that there is not a specific cause. This type of high blood pressure may be lowered by changing lifestyle factors such as:  Stress.  Smoking.  Lack of exercise.  Excessive weight.  Drug/tobacco/alcohol use.  Eating less salt. Most people do not have symptoms from high blood pressure until it has caused damage to the body. Effective treatment can often prevent, delay or reduce that damage. TREATMENT  When a cause has been identified, treatment for high blood pressure is directed at the cause. There are a large number of medications to treat HTN. These fall into several categories, and your caregiver will help you select the medicines that are best for you. Medications may have side effects. You should review side effects with your caregiver. If your blood pressure stays high after you have made lifestyle changes or started on medicines,   Your medication(s) may need to be changed.  Other problems may need to be addressed.  Be certain you understand your prescriptions, and know how and when to take your medicine.  Be sure to follow up with your caregiver within the time frame advised (usually within two weeks) to have your blood pressure rechecked and to review your medications.  If you are taking more than one medicine to lower your blood pressure, make sure you know how and at what times they should be taken. Taking two medicines at the same time can result in blood pressure that is too low. SEEK IMMEDIATE MEDICAL CARE IF:  You develop a severe headache, blurred or changing vision, or confusion.  You have unusual weakness or numbness, or a faint feeling.  You have severe chest or abdominal pain, vomiting, or breathing problems. MAKE SURE YOU:   Understand  these instructions.  Will watch your condition.  Will get help right away if you are not doing well or get worse. Document Released: 04/23/2005 Document Revised: 07/16/2011 Document Reviewed: 12/12/2007 [facility] Patient Information 2014 [place].   Emergency Department Resource Guide 1) Find a Doctor and Pay Out of Pocket Although you won't have to find out who is covered by your insurance plan, it is a good idea to ask around and get recommendations. You will then need to call the office  and see if the doctor you have chosen will accept you as a new patient and what types of options they offer for patients who are self-pay. Some doctors offer discounts or will set up payment plans for their patients who do not have insurance, but you will need to ask so you aren't surprised when you get to your appointment.  2) Contact Your Local Health Department Not all health departments have doctors that can see patients for sick visits, but many do, so it is worth a call to see if yours does. If you don't know where your local health department is, you can check in your phone book. The CDC also has a tool to help you locate your state's health department, and many state websites also have listings of all of their local health departments.  3) Find a [facility] If your illness is not likely to be very severe or complicated, you may want to try a walk in clinic. These are popping up all over the country in pharmacies, drugstores, and shopping centers. They're usually staffed by nurse practitioners or physician assistants that have been trained to treat common illnesses and complaints. They're usually fairly quick and inexpensive. However, if you have serious medical issues or chronic medical problems, these are probably not your best option.  No Primary Care Doctor: - Call Health Connect at  [phone] - they can help you locate a primary care doctor that  accepts your insurance, provides certain  services, etc. - Physician Referral Service- [phone]  Chronic Pain Problems: Organization         Address  Phone   Notes  [facility]  [phone] Patients need to be referred by their primary care doctor.   Medication Assistance: Organization         Address  Phone   Notes  [facility] Medication [facility] [address] [place], [place] [address] [phone] --Must be a resident of [facility] -- Must have NO insurance coverage whatsoever (no Medicaid/ Medicare, etc.) -- The pt. MUST have a primary care doctor that directs their care regularly and follows them in the community   MedAssist  [phone]   [employer]  [phone]    Agencies that provide inexpensive medical care: Organization         Address  Phone   Notes  [place]  [phone]   [facility]    [phone]   [facility] [place], [place] [address] [phone]   [place] [address], [place] [phone]   Planned Parenthood    [phone]   [facility]    [phone]   [place] and [place] [address], [place] Phone:  [phone], Fax:  [phone] Hours of Operation:  9 am - 6 pm, M-F.  Also accepts Medicaid/Medicare and self-pay.  [facility] [place], [address], [place] Phone: [phone], Fax: [phone]. Hours of Operation:  8:30 am - 5:30 pm, M-F.  Also accepts Medicaid and self-pay.  [facility] [address], [place] Phone: [phone]   [place], [place], [place] [phone], Ext. [phone] Mondays & Thursdays: 7-9 AM.  First 15 patients are seen on a first come, first serve basis.    [place]  Providers:  Organization         Address  Phone   Notes  [facility] [address], [place] [phone] Also accepts self-pay patients.  [facility] [id number] [place], [place]  [phone]   [place], [address], [place] [phone]   [facility] [address], [place] [phone]   Lucianne Lei [address], [place]   [phone] Only accepts [place] Access [place] patients after they have their name applied to their card.   Self-Pay (no insurance) in [facility]:  Organization         Address  Phone   Notes  Sickle Cell Patients, [facility] [place] [phone]   [facility] [place] [phone]   [facility] [place]  [place], [place], [place] [phone]   [facility]/Dr. Osei-Bonsu  [address] [place] or [address], [place] [phone] Phone number for both [place] and [place] locations is the same.  Urgent Medical and [facility] [address] [place] [phone]   [facility] [address] [place] or [address] [phone]   [facility] [address] [place] [phone], phone; [phone], fax Sees patients 1st and 3rd Saturday of every month.  Must not qualify for public or private insurance (i.e. Medicaid, Medicare, [place] Health Choice, Veterans' Benefits)  Household income should be no more than 200% of the poverty level The clinic cannot treat you if you are pregnant or think you are pregnant  Sexually transmitted diseases are not treated at the clinic.    Dental Care: Organization         Address  Phone  Notes  [facility] Department of [facility] [place] [phone] Accepts  children up to age 40 who are enrolled in [place] or [place]; pregnant women with a Medicaid card; and children who have applied for Medicaid or [place] Health Choice, but were declined, whose parents can pay a reduced fee at time of service.  [facility] Department of [facility]  [address], [place] [phone] Accepts children up to age 36 who are enrolled in [place] or [place]; pregnant women with a Medicaid card; and children who have applied for Medicaid or  Health Choice, but were declined, whose parents can pay a reduced fee at time of service.  [place] Adult [facility] Access PROGRAM  [place] [phone] Patients are seen by appointment only. Walk-ins are not accepted. [place] will see patients 43 years of age and older. Monday - Tuesday (8am-5pm) Most Wednesdays (8:30-5pm) $30 per visit, cash only  [facility] Adult Dental Access PROGRAM  [address], [facility] [phone] Patients are seen by appointment only. Walk-ins are not accepted. [place] will see patients 3 years of age and older. One Wednesday Evening (Monthly: Volunteer Based).  $30 per visit, cash only  [place]  [phone] for adults; Children under age 14, call Graduate Pediatric Dentistry at [phone]. Children aged 37-14, please call [phone] to request a pediatric application.  Dental services are provided in all areas of dental care including fillings, crowns and bridges, complete and partial dentures, implants, gum treatment, root canals, and extractions. Preventive care is also provided. Treatment is provided to both adults and children. Patients are selected via a lottery and there is often a waiting list.   [facility] [address], [place]  [phone] [email or web address]   [facility] [address] [place], [place] [phone], Ext. [phone] Second and  Fourth Thursday of each month, opens at 6:30 AM; Clinic ends at 9 AM.  Patients are seen on a first-come first-served basis, and a limited number are seen during each clinic.   [facility]  [address] Hillard Danker [place], [place] [phone]   Eligibility Requirements You must have lived in [place], [place], or [place] counties for at least the last three months.   You cannot be eligible for state or federal sponsored [employer], including [employer], [place], or [employer].   You generally cannot be eligible for healthcare insurance through your employer.    How to apply: Eligibility screenings are held every Tuesday and Wednesday afternoon from 1:00 pm until 4:00 pm. You do not need an appointment for the interview!  [facility] [address] [place], [place]   [place]  [place] Department  [place]  [phone]    Behavioral Health Resources in the Community: Intensive Outpatient Programs Organization         Address  Phone  Notes  [place]. [address], [place], [place] [phone]   [facility] Outpatient [address] [place], [place]   ADS: [facility] Drug [facility] [address], [place], [place]   [place] [address]  [place], [place] or [phone]   Substance Abuse Resources Organization         Address  Phone  Notes  Alcohol and Drug Services  [phone]   [place]  [phone]   [facility] [place]   Chinita Pester  [phone]   Residential & Outpatient Substance Abuse Program  [phone]   Psychological Services Organization         Address  Phone  Notes  [facility] [place]  [place]  [phone]   [place]  [address], [place] or [phone]    Mobile Crisis Teams Organization         Address  Phone  Notes  [facility], Mobile Crisis Care Unit  [phone]   [facility]  [address] [place], [place]   [place] [address], [place] [phone]    Self-Help/Support Groups Organization         Address  Phone             Notes  [place]. of [place] - variety of support groups  [place] Call for more information  Narcotics Anonymous (NA), [facility] [address], [employer] [place]  2 meetings at this location   Special educational needs teacher         Address  Phone  Notes  [facility] [place],    [place]  [place]  [address] [place] [id number], [place], [place]  [facility] [place], [place] [phone] Admissions: 8am-3pm M-F  Incentives Substance [place] [address],    [place], [place] [phone]   [facility] [address] [place], [place], [place]   [facility] [address]  [place], [place]   [facility] Programs - Intensive Outpatient [address] Kristeen [place] [address], The [place]   [facility] ([place] [address]  [place], [place] [phone] or [phone]   [facility] [address], [place], [place] Accepts Medicaid  [facility] [address]  [place] [phone] Substance Abuse/Addiction Treatment   [facility]         Address  Phone  Notes  [facility]  [phone]   Domenic Schwab, PhD [address] [place], [place]   [phone] or [phone]   [place], [place] [phone]   [facility] [phone], [place], [place] [phone] Insurance/Medicaid/sponsorship through [facility] [address] [place]                                    [place], [place] [phone] [place] [address], [place] [phone]    Dr. Adele Schilder  [phone]   [facility] Dept. 1) [address] [place] 2) [place] 3)  [place] [address], [place] [phone]  [phone]   [place] [phone] or [phone] (After Hours)

## 2013-08-19 NOTE — ED Notes (Addendum)
C/o nasal congestion, runny nose, non-productive cough, eyes drainage, and sore throat since yesterday.

## 2013-08-20 ENCOUNTER — Ambulatory Visit (INDEPENDENT_AMBULATORY_CARE_PROVIDER_SITE_OTHER): Payer: Commercial Managed Care - PPO | Admitting: Internal Medicine

## 2013-08-20 ENCOUNTER — Encounter: Payer: Self-pay | Admitting: Internal Medicine

## 2013-08-20 VITALS — BP 160/104 | HR 60 | Temp 99.3°F | Wt 204.0 lb

## 2013-08-20 DIAGNOSIS — J069 Acute upper respiratory infection, unspecified: Secondary | ICD-10-CM

## 2013-08-20 DIAGNOSIS — IMO0001 Reserved for inherently not codable concepts without codable children: Secondary | ICD-10-CM

## 2013-08-20 DIAGNOSIS — R030 Elevated blood-pressure reading, without diagnosis of hypertension: Secondary | ICD-10-CM

## 2013-08-20 MED ORDER — HYDROCODONE-HOMATROPINE 5-1.5 MG/5ML PO SYRP: 5.0000 mL | ORAL_SOLUTION | Freq: Every evening | ORAL | Status: AC | PRN

## 2013-08-20 NOTE — Assessment & Plan Note (Signed)
Elevated blood pressure, Elevated BP yesterday and today in the context of an acute illness. Reports that when he is healthy, BPs usually okay when checked elsewhere. Plan: Monitor his BPs If he ver  needs BP meds, will have to be sure  the choice doesn't interact with myasthenia gravis

## 2013-08-20 NOTE — Patient Instructions (Signed)
Rest, fluids , tylenol For cough, take Mucinex DM twice a day as needed  If severe cough at night, take hydrocodone (will make you sleepy) For congestion use OTC Nasocort: 2 nasal sprays on each side of the nose daily until you feel better   Call if no better in few days Call anytime if the symptoms are severe, you have high fever, short of breath, chest pain  Check the  blood pressure  weekly be sure it is between 110/60 and 140/85. Ideal blood pressure is 120/80. If it is consistently higher or lower, let me know   Schedule a physical exam within 3 months, fasting

## 2013-08-20 NOTE — Progress Notes (Signed)
Subjective:    Patient ID: Jason Ward, male    DOB: May 05, 1966, 48 y.o.   MRN: [id number]  DOS:  08/20/2013 Type of  visit: Patient went to the ER yesterday complaining of difficulty breathing due to nasal congestion.Chart is reviewed. BP was elevated at 155/94. CBC, chest x-ray, strep test were negative or normal. EKG showed no acute changes Troponin negative, creatinine 1.2  ROS Today, feels  slightly better, taking simply Tylenol. Shortness or breath has decrease, still has nasal congestion, mild cough. Denies chills, + subjective fever. No chest pain. No nausea, vomiting, diarrhea. No wheezing or sputum production. BP was elevated at the ER and elevated today as well. Reports he has taken his BP in the ambulatory setting and is usually ok/normal   Past Medical History  Diagnosis Date  . Myasthenia gravis 2008    Past Surgical History  Procedure Laterality Date  . Facial injury      as a child   Social History:   Single, children x 4   original from [place]   religion--muslim   in [place] since ~ 1999   works at a hotel   Never Smoked   Alcohol use-no       History   Social History  . Marital Status: Single    Spouse Name: N/A    Number of Children: N/A  . Years of Education: N/A   Occupational History  . Not on file.   Social History Main Topics  . Smoking status: Never Smoker   . Smokeless tobacco: Not on file  . Alcohol Use: No  . Drug Use: No  . Sexual Activity: Not on file   Other Topics Concern  . Not on file   Social History Narrative  . No narrative on file        Medication List       This list is accurate as of: 08/20/13  3:24 PM.  Always use your most recent med list.               HYDROcodone-homatropine 5-1.5 MG/5ML syrup  Commonly known as:  HYCODAN  Take 5 mLs by mouth at bedtime as needed for cough.     predniSONE 10 MG tablet  Commonly known as:  DELTASONE  Take 5 mg by mouth every other day.             Objective:   Physical Exam BP 160/104  Pulse 60  Temp(Src) 99.3 F (37.4 C)  Wt 204 lb (92.534 kg)  SpO2 99% General -- alert, well-developed, NAD.  HEENT-- Not pale. TMs Right slightly bulge, no discharge or red. Left tympanic membrane normal. throat symmetric, no redness or discharge.  Face symmetric, sinuses not tender to palpation. Nose slt  congested. Lungs -- normal respiratory effort, no intercostal retractions, no accessory muscle use, and normal breath sounds.  Heart-- normal rate, regular rhythm, no murmur.  Extremities-- no pretibial edema bilaterally  Neurologic--  alert & oriented X3. Speech normal, gait normal, strength normal in all extremities.  Psych-- Cognition and judgment appear intact. Cooperative with normal attention span and concentration. No anxious or depressed appearing.      Assessment & Plan:    URI, Workup at the ER yesterday normal, feeling slightly better. Recommend conservative treatment. If he needs antibiotics in the future  we'll have to pick one that has the least chance to bother his M. Gravis    Has not seen regularly for a CPX, recommend to  schedule one.

## 2013-08-20 NOTE — Progress Notes (Signed)
Pre visit review using our clinic review tool, if applicable. No additional management support is needed unless otherwise documented below in the visit note. 

## 2013-08-21 ENCOUNTER — Ambulatory Visit: Payer: Commercial Managed Care - PPO | Admitting: Internal Medicine

## 2013-08-21 LAB — CULTURE, GROUP A STREP

## 2013-08-24 NOTE — ED Provider Notes (Signed)
Medical screening examination/treatment/procedure(s) were conducted as a shared visit with non-physician practitioner(s) and myself.  I personally evaluated the patient during the encounter.   EKG Interpretation   Date/Time:  Wednesday August 19 2013 18:08:12 EDT Ventricular Rate:  101 PR Interval:  162 QRS Duration: 90 QT Interval:  330 QTC Calculation: 427 R Axis:   62 Text Interpretation:  Sinus tachycardia non-specific t wave changes likely  from repolarization abnormality w/ LVH Confirmed by Amera Banos  MD, Kyo Cocuzza  ([id number]) on 08/20/2013 11:03:01 AM      I interviewed and examined the patient. Lungs are CTAB. Cardiac exam wnl. Mild ttp of chest wall.  Abdomen soft.  Pt has hx of Myasthenia Gravis. He does note mild sob. He denies any hx of serious complications w/ MG before. I suspect he has a URI. He appears to be breathing normally on exam, VS unremarkable, O2 sat 100% on RA. Will have respiratory check NIF, TV, VC.   Pt continues to appear well - NIF, TV, VC are wnl. Pt continues to appear well. Will d/c w/ return precautions.   Blanchard Kelch, MD 08/24/13 1003

## 2015-09-06 ENCOUNTER — Encounter: Payer: Self-pay | Admitting: Internal Medicine

## 2020-05-16 ENCOUNTER — Ambulatory Visit: Payer: Self-pay | Attending: Internal Medicine

## 2020-05-16 DIAGNOSIS — Z23 Encounter for immunization: Secondary | ICD-10-CM

## 2020-05-16 NOTE — Progress Notes (Signed)
   Covid-19 Vaccination Clinic  Name:  Jason Ward    MRN: [id number] DOB: 1965-06-28  05/16/2020  [name] was observed post Covid-19 immunization for 15 minutes without incident. He was provided with Vaccine Information Sheet and instruction to access the V-Safe system.   [name] was instructed to call 911 with any severe reactions post vaccine: Marland Kitchen Difficulty breathing  . Swelling of face and throat  . A fast heartbeat  . A bad rash all over body  . Dizziness and weakness   Immunizations Administered    Name Date Dose VIS Date Route   Pfizer COVID-19 Vaccine 05/16/2020  4:14 PM 0.3 mL 02/24/2020 Intramuscular   Manufacturer: [place]   Lot: [id number]   NDC: [address]-4

## 2023-02-18 ENCOUNTER — Emergency Department (HOSPITAL_COMMUNITY)
Admission: EM | Admit: 2023-02-18 | Discharge: 2023-02-18 | Disposition: A | Discharge status: Home / Self Care | Payer: Commercial Managed Care - PPO

## 2023-02-18 DIAGNOSIS — D72819 Decreased white blood cell count, unspecified: Secondary | ICD-10-CM | POA: Diagnosis not present

## 2023-02-18 DIAGNOSIS — I10 Essential (primary) hypertension: Secondary | ICD-10-CM | POA: Diagnosis present

## 2023-02-18 LAB — CBC WITH DIFFERENTIAL/PLATELET
Abs Immature Granulocytes: 0.02 10*3/uL (ref 0.00–0.07)
Basophils Absolute: 0.1 10*3/uL (ref 0.0–0.1)
Basophils Relative: 2 %
Eosinophils Absolute: 0.1 10*3/uL (ref 0.0–0.5)
Eosinophils Relative: 1 %
HCT: 47.6 % (ref 39.0–52.0)
Hemoglobin: 15.6 g/dL (ref 13.0–17.0)
Immature Granulocytes: 1 %
Lymphocytes Relative: 33 %
Lymphs Abs: 1.3 10*3/uL (ref 0.7–4.0)
MCH: 30.8 pg (ref 26.0–34.0)
MCHC: 32.8 g/dL (ref 30.0–36.0)
MCV: 94.1 fL (ref 80.0–100.0)
Monocytes Absolute: 0.5 10*3/uL (ref 0.1–1.0)
Monocytes Relative: 13 %
Neutro Abs: 2 10*3/uL (ref 1.7–7.7)
Neutrophils Relative %: 50 %
Platelets: 228 10*3/uL (ref 150–400)
RBC: 5.06 MIL/uL (ref 4.22–5.81)
RDW: 12.1 % (ref 11.5–15.5)
Smear Review: ADEQUATE
WBC: 3.9 10*3/uL — ABNORMAL LOW (ref 4.0–10.5)
nRBC: 0 % (ref 0.0–0.2)

## 2023-02-18 LAB — BASIC METABOLIC PANEL
Anion gap: 11 (ref 5–15)
BUN: 14 mg/dL (ref 6–20)
CO2: 21 mmol/L — ABNORMAL LOW (ref 22–32)
Calcium: 9.3 mg/dL (ref 8.9–10.3)
Chloride: 105 mmol/L (ref 98–111)
Creatinine, Ser: 1.44 mg/dL — ABNORMAL HIGH (ref 0.61–1.24)
GFR, Estimated: 56 mL/min — ABNORMAL LOW (ref >60)
Glucose, Bld: 85 mg/dL (ref 70–99)
Potassium: 4.4 mmol/L (ref 3.5–5.1)
Sodium: 137 mmol/L (ref 135–145)

## 2023-02-18 MED ORDER — AMLODIPINE BESYLATE 5 MG PO TABS: 5.0000 mg | ORAL_TABLET | Freq: Every day | ORAL | 0 refills | Status: DC

## 2023-02-18 MED ORDER — AMLODIPINE BESYLATE 5 MG PO TABS: 5.0000 mg | ORAL_TABLET | Freq: Every day | ORAL | 0 refills | Status: AC

## 2023-02-18 MED ORDER — AMLODIPINE BESYLATE 5 MG PO TABS
5.0000 mg | ORAL_TABLET | Freq: Once | ORAL | Status: AC
  Administered 2023-02-18: 5 mg via ORAL
  Filled 2023-02-18: qty 1

## 2023-02-18 NOTE — ED Provider Notes (Signed)
[place] EMERGENCY DEPARTMENT AT [facility] Provider Note   CSN: [id number] Arrival date & time: 02/18/23  1053     History  Chief Complaint  Patient presents with   Hypertension    Marcello Tuzzolino is a 57 y.o. male patient with past medical history of myasthenia gravis presenting to the emergency room today for high blood pressure.  Patient reports he has been monitoring his blood pressure daily and today had reading of 200 systolic.  Patient's wife encouraged him to come to the emergency room.  Patient is not having any current symptoms and has not been taking anything for blood pressure.  Patient was previously diagnosed with hypertension prescribed hydrochlorothiazide, amlodipine, lisinopril but stopped taking them over a year ago.  Denies any headache, vision changes, dizziness, chest pain, abdominal pain nausea vomiting, no focal neurological symptoms.   Hypertension       Home Medications Prior to Admission medications   Medication Sig Start Date End Date Taking? Authorizing Provider  HYDROcodone-homatropine (HYCODAN) 5-1.5 MG/5ML syrup Take 5 mLs by mouth at bedtime as needed for cough. 08/20/13   Wanda Plump, MD  predniSONE (DELTASONE) 10 MG tablet Take 5 mg by mouth every other day.     [provider]      Allergies    Patient has no known allergies.    Review of Systems   Review of Systems  Constitutional:        High BP    Physical Exam Updated Vital Signs BP (!) 167/109 (BP Location: Right Arm)   Pulse 74   Temp 97.8 F (36.6 C) (Oral)   Resp 17   SpO2 98%  Physical Exam Vitals and nursing note reviewed.  Constitutional:      General: He is not in acute distress.    Appearance: He is not ill-appearing, toxic-appearing or diaphoretic.  HENT:     Head: Normocephalic and atraumatic.  Eyes:     General: No scleral icterus.    Conjunctiva/sclera: Conjunctivae normal.  Cardiovascular:     Rate and Rhythm: Normal rate and  regular rhythm.     Pulses: Normal pulses.     Heart sounds: Normal heart sounds. No murmur heard.    No gallop.  Pulmonary:     Effort: Pulmonary effort is normal. No respiratory distress.     Breath sounds: Normal breath sounds. No wheezing or rales.  Abdominal:     General: Abdomen is flat. Bowel sounds are normal. There is no distension.     Palpations: Abdomen is soft.     Tenderness: There is no abdominal tenderness.  Musculoskeletal:     Right lower leg: No edema.     Left lower leg: No edema.  Skin:    General: Skin is warm and dry.     Capillary Refill: Capillary refill takes less than 2 seconds.     Findings: No lesion.  Neurological:     General: No focal deficit present.     Mental Status: He is alert and oriented to person, place, and time. Mental status is at baseline.     Cranial Nerves: No cranial nerve deficit.     Sensory: No sensory deficit.     Motor: No weakness.     Gait: Gait normal.  Psychiatric:        Mood and Affect: Mood normal.        Behavior: Behavior normal.     ED Results / Procedures / Treatments  Labs (all labs ordered are listed, but only abnormal results are displayed) Labs Reviewed  CBC WITH DIFFERENTIAL/PLATELET - Abnormal; Notable for the following components:      Result Value   WBC 3.9 (*)    All other components within normal limits  BASIC METABOLIC PANEL - Abnormal; Notable for the following components:   CO2 21 (*)    Creatinine, Ser 1.44 (*)    GFR, Estimated 56 (*)    All other components within normal limits    EKG None  Radiology No results found.  Procedures Procedures    Medications Ordered in ED Medications - No data to display  ED Course/ Medical Decision Making/ A&P                                 Medical Decision Making Risk Prescription drug management.   This patient presents to the ED for concern of Hypertension, this involves an extensive number of treatment options, and is a complaint that  carries with it a high risk of complications and morbidity.  The differential diagnosis includes uncontrolled hypertension, emergency, urgency   Co morbidities that complicate the patient evaluation  HTN Myasthenia gravis   Additional history obtained:  None    Lab Tests:  I personally interpreted labs.  The pertinent results include:   Cbc 3wbc 3.9, no anemia with hgb 15.6 Bmp  Cr 1.44, GFR 56 no recent labs    Imaging Studies ordered:  None    Cardiac Monitoring: / EKG:  The patient was maintained on a cardiac monitor. Ordered EKG, patient did not want to wait to be seen.    Consultations Obtained:  None    Problem List / ED Course / Critical interventions / Medication management  Patient reporting to emergency room after having high blood pressure at home.  Patient is not having any symptoms.  Reports he has never had symptoms associated with his high blood pressure.  Overall physical exam and labs reassuring, patient has no signs of hypertensive emergency.  Discussed starting patient on blood pressure medicine from emergency room, patient and wife agreed to this plan and will follow-up outpatient for further monitoring.  Discussed monitoring blood pressure and recording at home. All questions answered, agreed to plan.   Reevaluation showed that the patient stayed the same I have reviewed the patients home medicines and have made adjustments as needed   Plan F/u w/ PCP in 2-3d to ensure resolution of sx.  Sent Amlodipine, follow up with PCP for more refills  Patient was given return precautions. Patient stable for discharge at this time.  Patient educated on sx/dx and verbalized understanding of plan. Return to ER w/ new or worsening sx.          Final Clinical Impression(s) / ED Diagnoses Final diagnoses:  None    Rx / DC Orders ED Discharge Orders     None         Smitty Knudsen, PA-C 02/18/23 1548    Durwin Glaze, MD 02/18/23  [phone]

## 2023-02-18 NOTE — Discharge Instructions (Addendum)
You are seen in the emergency room today for high blood pressure.  Please continue to monitor blood pressure at home.  I recommend taking blood pressure at least twice a day and recording blood pressure.  Please call primary care to see if you can schedule sooner follow-up appointment from today's visit.  Return to the emergency room if you have any new or worsening symptoms.

## 2023-02-18 NOTE — ED Triage Notes (Signed)
Pt to ED POV from home. Pt states he has hx of HTN. Pt states he is not currently not on any BP medications due to recent changes in PCP (pt states he cannot get an appointment with a new PCP til December). Pt states he took his BP this morning and it was 200/100. Pt denies headache, CP, dizziness, or SOB.
